# Patient Record
Sex: Male | Born: 1990 | Race: Black or African American | Hispanic: No | Marital: Single | Smoking: Current every day smoker
Health system: Southern US, Community
[De-identification: ages and names within clinical notes are randomized; demographics above are authoritative.]

## PROBLEM LIST (undated history)

## (undated) DIAGNOSIS — A4901 Methicillin susceptible Staphylococcus aureus infection, unspecified site: Secondary | ICD-10-CM

## (undated) DIAGNOSIS — R21 Rash and other nonspecific skin eruption: Secondary | ICD-10-CM

## (undated) DIAGNOSIS — R4189 Other symptoms and signs involving cognitive functions and awareness: Secondary | ICD-10-CM

## (undated) DIAGNOSIS — F909 Attention-deficit hyperactivity disorder, unspecified type: Secondary | ICD-10-CM

## (undated) HISTORY — DX: Other symptoms and signs involving cognitive functions and awareness: R41.89

## (undated) HISTORY — DX: Methicillin susceptible Staphylococcus aureus infection, unspecified site: A49.01

## (undated) HISTORY — DX: Rash and other nonspecific skin eruption: R21

## (undated) HISTORY — DX: Attention-deficit hyperactivity disorder, unspecified type: F90.9

---
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
## 2010-08-11 DIAGNOSIS — A4901 Methicillin susceptible Staphylococcus aureus infection, unspecified site: Secondary | ICD-10-CM

## 2010-08-11 HISTORY — DX: Methicillin susceptible Staphylococcus aureus infection, unspecified site: A49.01

## 2010-08-16 DIAGNOSIS — R21 Rash and other nonspecific skin eruption: Secondary | ICD-10-CM

## 2010-08-16 HISTORY — DX: Rash and other nonspecific skin eruption: R21

## 2010-09-20 ENCOUNTER — Encounter: Payer: Self-pay | Admitting: Internal Medicine

## 2010-09-20 ENCOUNTER — Ambulatory Visit (INDEPENDENT_AMBULATORY_CARE_PROVIDER_SITE_OTHER): Payer: Medicaid Other | Admitting: Internal Medicine

## 2010-09-20 VITALS — BP 112/81 | HR 101 | Temp 98.2°F | Ht 75.0 in | Wt 238.0 lb

## 2010-09-20 DIAGNOSIS — F259 Schizoaffective disorder, unspecified: Secondary | ICD-10-CM

## 2010-09-20 DIAGNOSIS — F17210 Nicotine dependence, cigarettes, uncomplicated: Secondary | ICD-10-CM | POA: Insufficient documentation

## 2010-09-20 DIAGNOSIS — L089 Local infection of the skin and subcutaneous tissue, unspecified: Secondary | ICD-10-CM

## 2010-09-20 DIAGNOSIS — B958 Unspecified staphylococcus as the cause of diseases classified elsewhere: Secondary | ICD-10-CM | POA: Insufficient documentation

## 2010-09-20 DIAGNOSIS — Z7251 High risk heterosexual behavior: Secondary | ICD-10-CM

## 2010-09-20 DIAGNOSIS — F17200 Nicotine dependence, unspecified, uncomplicated: Secondary | ICD-10-CM

## 2010-09-20 DIAGNOSIS — F988 Other specified behavioral and emotional disorders with onset usually occurring in childhood and adolescence: Secondary | ICD-10-CM

## 2010-09-20 MED ORDER — CEPHALEXIN 500 MG PO CAPS: 500.0000 mg | ORAL_CAPSULE | Freq: Four times a day (QID) | ORAL | Status: AC

## 2010-09-20 MED ORDER — CEPHALEXIN 500 MG PO CAPS: 500.0000 mg | ORAL_CAPSULE | Freq: Four times a day (QID) | ORAL | Status: DC

## 2010-09-20 NOTE — Patient Instructions (Signed)
#  1 stop taking doxycycline.  #2 start taking Keflex as directed.

## 2010-09-20 NOTE — Progress Notes (Signed)
  Subjective:    Patient ID: Travis Fowler, male    DOB: 1991-05-07, 20 y.o.   MRN: [id number]  HPI Travis Fowler is a 20 year old was referred to me by Dr. Leilani Able for evaluation of recent staph infections on his fingertips. He lives in a group home and he is care provider is with him today and provides much of his history. He barely has schizoaffective disorder and attention deficit disorder and has lived much of his life in group homes. He says that in November of last year he began to develop painful swellings on his fingertips. He seems to think that this began after he moved a Christmas tree. He has not had any fever, chills, or sweats. The lesions have persisted on multiple fingertips. He recalls having one boil on his buttocks several years ago but no other skin lesions. He was referred to Dr. Para Skeans, dermatologist, who culture the lesions last month. The culture grew methicillin sensitive staph aureus sensitive to tetracycline antibiotics. He has been on doxycycline for about one month and noted significant improvement in the lesions after starting this antibiotic. However, he is a little concerned that some of the lesions are a little firmer recently.  He has been trying to lose weight over the last year and has made significant changes in his eating habits. He believes he has lost about 30 pounds all of it intentionally. He has been sexually active with multiple male partners. He has not use condoms consistently. He does not know of any history of 60 transmitted diseases in himself or in any of his partners but has never been tested that he knows of.    Review of Systems  Constitutional: Negative for fever, chills, diaphoresis, appetite change and fatigue.  HENT: Negative for mouth sores.   Eyes: Negative for discharge, redness and visual disturbance.  Respiratory: Negative for cough and shortness of breath.   Genitourinary: Negative for dysuria, discharge and genital sores.    Musculoskeletal: Negative for myalgias, joint swelling and arthralgias.       Objective:   Physical Exam  Constitutional: He appears well-developed and well-nourished. No distress.  HENT:  Mouth/Throat: Oropharynx is clear and moist. No oropharyngeal exudate.  Eyes: Conjunctivae are normal. Pupils are equal, round, and reactive to light. Right eye exhibits no discharge. Left eye exhibits no discharge.  Cardiovascular: Normal rate and regular rhythm.   No murmur heard. Pulmonary/Chest: Breath sounds normal. He has no wheezes.  Abdominal: Soft. There is no tenderness.  Genitourinary: Penis normal.  Musculoskeletal: Normal range of motion. He exhibits no tenderness.  Lymphadenopathy:    He has no cervical adenopathy.       He has no palpable adenopathy.  Skin:       There are multiple firm, slightly scarred, lesions on multiple fingertips. They're nontender and there is no drainage, redness, or other signs of active infection. He has no splinter hemorrhages.  Psychiatric: He has a normal mood and affect. His behavior is normal. Thought content normal.          Assessment & Plan:

## 2010-09-20 NOTE — Progress Notes (Signed)
Addended by: Jennet Maduro on: 09/20/2010 03:18 PM   Modules accepted: Orders

## 2010-09-20 NOTE — Assessment & Plan Note (Signed)
I am not certain what has caused his fingertip infections. I do not see any convincing evidence that he said any systemic infection or systemic emboli. I doubt that he has been bacteremic given the lack of fever and the duration of these lesions. He is clearly better with doxycycline therapy but he and his caregiver are worried that they may be coming back a little bit. I will switch from doxycycline to Keflex to give him a little better her more durable coverage for MSSA. I've also recommended testing for 60 transmitted diseases and he agrees.

## 2010-09-21 LAB — GC/CHLAMYDIA PROBE AMP, URINE: GC Probe Amp, Urine: NEGATIVE

## 2010-10-11 ENCOUNTER — Ambulatory Visit (INDEPENDENT_AMBULATORY_CARE_PROVIDER_SITE_OTHER): Payer: Medicaid Other | Admitting: Internal Medicine

## 2010-10-11 ENCOUNTER — Other Ambulatory Visit: Payer: Self-pay | Admitting: Licensed Clinical Social Worker

## 2010-10-11 ENCOUNTER — Encounter: Payer: Self-pay | Admitting: Internal Medicine

## 2010-10-11 VITALS — BP 104/63 | HR 105 | Temp 98.1°F | Ht 75.0 in | Wt 215.5 lb

## 2010-10-11 DIAGNOSIS — L089 Local infection of the skin and subcutaneous tissue, unspecified: Secondary | ICD-10-CM

## 2010-10-11 DIAGNOSIS — B958 Unspecified staphylococcus as the cause of diseases classified elsewhere: Secondary | ICD-10-CM

## 2010-10-11 MED ORDER — CEPHALEXIN 500 MG PO CAPS: 500.0000 mg | ORAL_CAPSULE | Freq: Four times a day (QID) | ORAL | Status: AC

## 2010-10-11 MED ORDER — CEPHALEXIN 500 MG PO CAPS: 500.0000 mg | ORAL_CAPSULE | Freq: Four times a day (QID) | ORAL | Status: DC

## 2010-10-11 NOTE — Progress Notes (Signed)
  Subjective:    Patient ID: Travis Fowler, male    DOB: [birth year], 19 y.o.   MRN: [id number]  HPI Travis Fowler is in for his f/u visit. He took Keflex for 10 days after his recent visit and developed a new lesion on his right index finger while on it. He has also not hypopigmentation over his nuckles recently.    Review of Systems     Objective:   Physical Exam  Constitutional: He appears well-developed and well-nourished. No distress.  HENT:  Mouth/Throat: Oropharynx is clear and moist. No oropharyngeal exudate.  Skin:       He has a scab on his right index fingertip.There is no cellulitis, drainage or fluctuance. It is non tender. He does have linear hypopigmentation over his proximal MCP joints.          Assessment & Plan:

## 2010-10-11 NOTE — Assessment & Plan Note (Signed)
Travis Fowler and his care giver think Abs help these lesions but I do not think this is simply a case of recurrent Staph infections. I would like to get Dr. Dorita Sciara opinion about the potential utility of a biopsy. I also wonder if the hypopigmentation might be a clue as to the cause of these lesions.

## 2010-11-15 ENCOUNTER — Ambulatory Visit: Payer: Medicaid Other | Admitting: Internal Medicine

## 2014-02-01 ENCOUNTER — Emergency Department (HOSPITAL_COMMUNITY)
Admission: EM | Admit: 2014-02-01 | Discharge: 2014-02-01 | Disposition: A | Discharge status: Home / Self Care | Payer: Medicaid Other | Attending: Emergency Medicine | Admitting: Emergency Medicine

## 2014-02-01 ENCOUNTER — Encounter (HOSPITAL_COMMUNITY): Payer: Self-pay | Admitting: Emergency Medicine

## 2014-02-01 DIAGNOSIS — Z22322 Carrier or suspected carrier of Methicillin resistant Staphylococcus aureus: Secondary | ICD-10-CM | POA: Diagnosis not present

## 2014-02-01 DIAGNOSIS — Z79899 Other long term (current) drug therapy: Secondary | ICD-10-CM | POA: Insufficient documentation

## 2014-02-01 DIAGNOSIS — L089 Local infection of the skin and subcutaneous tissue, unspecified: Secondary | ICD-10-CM | POA: Insufficient documentation

## 2014-02-01 DIAGNOSIS — F17200 Nicotine dependence, unspecified, uncomplicated: Secondary | ICD-10-CM | POA: Diagnosis not present

## 2014-02-01 DIAGNOSIS — F909 Attention-deficit hyperactivity disorder, unspecified type: Secondary | ICD-10-CM | POA: Insufficient documentation

## 2014-02-01 MED ORDER — MUPIROCIN CALCIUM 2 % NA OINT: TOPICAL_OINTMENT | NASAL | Status: DC

## 2014-02-01 NOTE — ED Notes (Addendum)
Pt c/o skin infection to abdomen and thighs. Pt goes to a day program everyday. Caregiver states the program will not allow pt back until it is confirmed he doesn't have MRSA. Skin infection was first reported to caregiver a few weeks ago. Pt was seen by PCP on Thursday and was given Bactroban. PCP did not perform a culture. No other complaints

## 2014-02-01 NOTE — Discharge Instructions (Signed)
Please use the antibiotic ointment in the nose daily to help treat any possible MRSA bacteria colonization. Followup with your primary care provider for continued evaluation and treatment.    MRSA FAQs What is MRSA? Staphylococcus aureus (pronounced staff-ill-oh-KOK-us AW-ree-us), or "Staph" is a very common germ that about 1 out of every 3 people have on their skin or in their nose. This germ does not cause any problems for most people who have it on their skin. But sometimes it can cause serious infections such as skin or wound infections, pneumonia, or infections of the blood.  Antibiotics are given to kill Staph germs when they cause infections. Some Staph are resistant, meaning they cannot be killed by some antibiotics. "Methicillin-resistant Staphylococcus aureus" or "MRSA" is a type of Staph that is resistant to some of the antibiotics that are often used to treat Staph infections. Who is most likely to get an MRSA infection? In the hospital, people who are more likely to get an MRSA infection are people who:  have other health conditions making them sick  have been in the hospital or a nursing home  have been treated with antibiotics. People who are healthy and who have not been in the hospital or a nursing home can also get MRSA infections. These infections usually involve the skin. More information about this type of MRSA infection, known as "community-associated MRSA" infection, is available from the Centers for Disease Control and Prevention (CDC). ReportNation.uy How do I get an MRSA infection? People who have MRSA germs on their skin or who are infected with MRSA may be able to spread the germ to other people. MRSA can be passed on to bed linens, bed rails, bathroom fixtures, and medical equipment. It can spread to other people on contaminated equipment and on the hands of doctors, nurses, other healthcare providers and visitors. Can MRSA infections be treated? Yes, there  are antibiotics that can kill MRSA germs. Some patients with MRSA abscesses may need surgery to drain the infection. Your healthcare provider will determine which treatments are best for you. What are some of the things that hospitals are doing to prevent MRSA infections? To prevent MRSA infections, doctors, nurses and other healthcare providers:  Clean their hands with soap and water or an alcohol-based hand rub before and after caring for every patient.  Carefully clean hospital rooms and medical equipment.  Use Contact Precautions when caring for patients with MRSA. Contact Precautions mean:  Whenever possible, patients with MRSA will have a single room or will share a room only with someone else who also has MRSA.  Healthcare providers will put on gloves and wear a gown over their clothing while taking care of patients with MRSA.  Visitors may also be asked to wear a gown and gloves.  When leaving the room, hospital providers and visitors remove their gown and gloves and clean their hands.  Patients on Contact Precautions are asked to stay in their hospital rooms as much as possible. They should not go to common areas, such as the gift shop or cafeteria. They may go to other areas of the hospital for treatments and tests.  May test some patients to see if they have MRSA on their skin. This test involves rubbing a cotton-tipped swab in the patient's nostrils or on the skin. What can I do to help prevent MRSA infections? In the hospital  Make sure that all doctors, nurses, and other healthcare providers clean their hands with soap and water or an  alcohol-based hand rub before and after caring for you. If you do not see your providers clean their hands, please ask them to do so. When you go home  If you have wounds or an intravascular device (such as a catheter or dialysis port) make sure that you know how to take care of them. Can my friends and family get MRSA when they visit me? The  chance of getting MRSA while visiting a person who has MRSA is very low. To decrease the chance of getting MRSA your family and friends should:  Clean their hands before they enter your room and when they leave.  Ask a healthcare provider if they need to wear protective gowns and gloves when they visit you. What do I need to do when I go home from the hospital? To prevent another MRSA infection and to prevent spreading MRSA to others:  Keep taking any antibiotics prescribed by your doctor. Don't take half-doses or stop before you complete your prescribed course.  Clean your hands often, especially before and after changing your wound dressing or bandage.  People who live with you should clean their hands often as well.  Keep any wounds clean and change bandages as instructed until healed.  Avoid sharing personal items such as towels or razors.  Wash and dry your clothes and bed linens in the warmest temperatures recommended on the labels.  Tell your healthcare providers that you have MRSA. This includes home health nurses and aides, therapists, and personnel in doctors' offices.  Your doctor may have more instructions for you. If you have questions, please ask your doctor or nurse. Developed and co-sponsored by [employer] for [employer] [place] [phone]); Infectious Diseases Society of America (IDSA); [facility] Association; Association for Professionals in Infection Control and Epidemiology (APIC); Centers for Disease Control and Prevention (CDC); and [employer] Document Released: 06/03/2013 Document Reviewed: 06/03/2013 [facility] Patient Information 2015 [place], [place]. This information is not intended to replace advice given to you by your health care provider. Make sure you discuss any questions you have with your health care provider.

## 2014-02-01 NOTE — ED Provider Notes (Signed)
CSN: [id number]     Arrival date & time 02/01/14  2042 History   First MD Initiated Contact with Patient 02/01/14 2109     Chief Complaint  Patient presents with  . Recurrent Skin Infections   HPI  History provided by the patient and caregiver. Patient is a 23 year old male who presents with concerns for possible MRSA infection. Patient recently had infections the skin of his lower abdomen that was draining some pus. He was seen by PCP earlier this week and given prescriptions for doxycycline. He has been taking this with good improvement of the wounds. Patient does attend a day program and they are requesting to know if he has MRSA. Patient and caregiver are requesting that his infection or blood be tested. Denies any other complaints. No redness or pain. No fever, chills or sweats. There is no other bleeding or drainage.   Past Medical History  Diagnosis Date  . 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 Alinda Sierras  . 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 ADHD (attention deficit hyperactivity disorder)   . 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 Staphylococcus aureus infection 08/11/2010    left thumb   History reviewed. No pertinent past surgical history. History reviewed. No pertinent family history. History  Substance Use Topics  . Smoking status: Current Every Day Smoker -- 0.30 packs/day for 3 years  . Smokeless tobacco: Never Used  . Alcohol Use: No    Review of Systems  Constitutional: Negative for fever, chills and diaphoresis.  All other systems reviewed and are negative.     Allergies  Review of patient's allergies indicates no known allergies.  Home Medications   Prior to Admission medications   Medication Sig Start Date End Date Taking? Authorizing Provider  Atomoxetine HCl (STRATTERA PO) Take by mouth.      Historical Provider, MD  Divalproex Sodium (DEPAKOTE PO) Take by mouth.      Historical  Provider, MD  HALOPERIDOL PO Take by mouth.      Historical Provider, MD  HYDROXYZINE HCL PO Take by mouth.      Historical Provider, MD  METHYLPHENIDATE HCL CR PO Take by mouth.      Historical Provider, MD  QUEtiapine Fumarate (SEROQUEL PO) Take by mouth.      Historical Provider, MD  SERTRALINE HCL PO Take by mouth.      Historical Provider, MD  TOPIRAMATE PO Take by mouth.      Historical Provider, MD   BP 128/71  Pulse 106  Temp(Src) 98.6 F (37 C) (Oral)  Resp 16  Ht  (1.956 m)  Wt 205 lb (92.987 kg)  BMI 24.30 kg/m2  SpO2 100% Physical Exam  Nursing note and vitals reviewed. Constitutional: He appears well-developed and well-nourished. No distress.  HENT:  Head: Normocephalic.  Cardiovascular: Normal rate and regular rhythm.   Pulmonary/Chest: Effort normal and breath sounds normal. No respiratory distress.  Abdominal: Soft.  Neurological: He is alert.  Skin: Skin is warm.  Well healing area at the lower abdomen with slight darkening and peeling of the skin. No induration. No bleeding or drainage.    ED Course  Procedures   COORDINATION OF CARE:  Nursing notes reviewed. Vital signs reviewed. Initial pt interview and examination performed.   Filed Vitals:   02/01/14 2049  BP: 128/71  Pulse: 106  Temp: 98.6 F (37 C)  TempSrc: Oral  Resp: 16  Height:  (1.956 m)  Weight: 205 lb (92.987 kg)  SpO2: 100%    9:36 PM-patient and caregiver concern for possible MRSA infection no signs of significant infection at this time currently treated with doxycycline. Wounds appear to be hearing well. I discussed with patient and caregiver that there were no good way to test the wound at this point. They're still concerned about the possibilities that he is a carrier for MRSA and we discussed hostilities of nasal swab screen culture. They do wish to have this performed. I will also plan to prescribe Bactroban nasally for them.       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

## 2014-02-03 ENCOUNTER — Telehealth (HOSPITAL_BASED_OUTPATIENT_CLINIC_OR_DEPARTMENT_OTHER): Payer: Self-pay | Admitting: Emergency Medicine

## 2014-02-04 LAB — NASAL CULTURE (N/P): Culture: NORMAL

## 2014-02-04 MED ORDER — MUPIROCIN CALCIUM 2 % NA OINT: TOPICAL_OINTMENT | NASAL | Status: DC

## 2014-02-05 NOTE — ED Provider Notes (Signed)
Medical screening examination/treatment/procedure(s) were performed by non-physician practitioner and as supervising physician I was immediately available for consultation/collaboration.   EKG Interpretation None        Taiwan Millon, MD 02/05/14 0002 

## 2014-03-05 ENCOUNTER — Encounter (HOSPITAL_COMMUNITY): Payer: Self-pay | Admitting: Emergency Medicine

## 2014-03-05 ENCOUNTER — Emergency Department (HOSPITAL_COMMUNITY)
Admission: EM | Admit: 2014-03-05 | Discharge: 2014-03-06 | Disposition: A | Discharge status: Home / Self Care | Payer: Medicaid Other | Attending: Emergency Medicine | Admitting: Emergency Medicine

## 2014-03-05 DIAGNOSIS — Z872 Personal history of diseases of the skin and subcutaneous tissue: Secondary | ICD-10-CM | POA: Insufficient documentation

## 2014-03-05 DIAGNOSIS — Z79899 Other long term (current) drug therapy: Secondary | ICD-10-CM | POA: Insufficient documentation

## 2014-03-05 DIAGNOSIS — Z046 Encounter for general psychiatric examination, requested by authority: Secondary | ICD-10-CM | POA: Diagnosis present

## 2014-03-05 DIAGNOSIS — F988 Other specified behavioral and emotional disorders with onset usually occurring in childhood and adolescence: Secondary | ICD-10-CM

## 2014-03-05 DIAGNOSIS — F1510 Other stimulant abuse, uncomplicated: Secondary | ICD-10-CM | POA: Diagnosis not present

## 2014-03-05 DIAGNOSIS — R45850 Homicidal ideations: Secondary | ICD-10-CM | POA: Diagnosis not present

## 2014-03-05 DIAGNOSIS — F4324 Adjustment disorder with disturbance of conduct: Secondary | ICD-10-CM | POA: Diagnosis present

## 2014-03-05 DIAGNOSIS — F911 Conduct disorder, childhood-onset type: Secondary | ICD-10-CM | POA: Diagnosis not present

## 2014-03-05 DIAGNOSIS — R451 Restlessness and agitation: Secondary | ICD-10-CM | POA: Diagnosis present

## 2014-03-05 DIAGNOSIS — F909 Attention-deficit hyperactivity disorder, unspecified type: Secondary | ICD-10-CM | POA: Insufficient documentation

## 2014-03-05 DIAGNOSIS — F17200 Nicotine dependence, unspecified, uncomplicated: Secondary | ICD-10-CM | POA: Diagnosis not present

## 2014-03-05 DIAGNOSIS — F259 Schizoaffective disorder, unspecified: Secondary | ICD-10-CM

## 2014-03-05 DIAGNOSIS — Z8619 Personal history of other infectious and parasitic diseases: Secondary | ICD-10-CM | POA: Insufficient documentation

## 2014-03-05 DIAGNOSIS — R454 Irritability and anger: Secondary | ICD-10-CM

## 2014-03-05 LAB — ETHANOL: Alcohol, Ethyl (B): <11 mg/dL (ref 0–11)

## 2014-03-05 LAB — ACETAMINOPHEN LEVEL

## 2014-03-05 LAB — CBC
HEMATOCRIT: 37.8 % — AB (ref 39.0–52.0)
HEMOGLOBIN: 12.4 g/dL — AB (ref 13.0–17.0)
MCH: 31.1 pg (ref 26.0–34.0)
MCHC: 32.8 g/dL (ref 30.0–36.0)
MCV: 94.7 fL (ref 78.0–100.0)
Platelets: 265 10*3/uL (ref 150–400)
RBC: 3.99 MIL/uL — ABNORMAL LOW (ref 4.22–5.81)
RDW: 13.4 % (ref 11.5–15.5)
WBC: 7 10*3/uL (ref 4.0–10.5)

## 2014-03-05 LAB — COMPREHENSIVE METABOLIC PANEL
ALK PHOS: 41 U/L (ref 39–117)
ALT: 15 U/L (ref 0–53)
ANION GAP: 12 (ref 5–15)
AST: 30 U/L (ref 0–37)
Albumin: 3.7 g/dL (ref 3.5–5.2)
BILIRUBIN TOTAL: 0.3 mg/dL (ref 0.3–1.2)
BUN: 15 mg/dL (ref 6–23)
CHLORIDE: 105 meq/L (ref 96–112)
CO2: 20 meq/L (ref 19–32)
Calcium: 9.3 mg/dL (ref 8.4–10.5)
Creatinine, Ser: 0.73 mg/dL (ref 0.50–1.35)
GLUCOSE: 93 mg/dL (ref 70–99)
POTASSIUM: 4.2 meq/L (ref 3.7–5.3)
Sodium: 137 mEq/L (ref 137–147)
Total Protein: 8.3 g/dL (ref 6.0–8.3)

## 2014-03-05 LAB — SALICYLATE LEVEL: Salicylate Lvl: <2 mg/dL — ABNORMAL LOW (ref 2.8–20.0)

## 2014-03-05 MED ORDER — IBUPROFEN 200 MG PO TABS: 600.0000 mg | ORAL_TABLET | Freq: Three times a day (TID) | ORAL | Status: DC | PRN

## 2014-03-05 MED ORDER — ONDANSETRON HCL 4 MG PO TABS: 4.0000 mg | ORAL_TABLET | Freq: Three times a day (TID) | ORAL | Status: DC | PRN

## 2014-03-05 MED ORDER — NICOTINE 21 MG/24HR TD PT24: 21.0000 mg | MEDICATED_PATCH | Freq: Every day | TRANSDERMAL | Status: DC

## 2014-03-05 MED ORDER — LORAZEPAM 1 MG PO TABS: 1.0000 mg | ORAL_TABLET | Freq: Three times a day (TID) | ORAL | Status: DC | PRN

## 2014-03-05 NOTE — ED Notes (Addendum)
[facility] [phone] [facility] Password: [email or web address]
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
## 2014-03-05 NOTE — ED Notes (Signed)
Pt unable to void at this time. 

## 2014-03-06 ENCOUNTER — Encounter (HOSPITAL_COMMUNITY): Payer: Self-pay | Admitting: Psychiatry

## 2014-03-06 DIAGNOSIS — R451 Restlessness and agitation: Secondary | ICD-10-CM | POA: Diagnosis present

## 2014-03-06 DIAGNOSIS — F4324 Adjustment disorder with disturbance of conduct: Secondary | ICD-10-CM

## 2014-03-06 LAB — RAPID URINE DRUG SCREEN, HOSP PERFORMED
Amphetamines: POSITIVE — AB
BARBITURATES: NOT DETECTED
Benzodiazepines: NOT DETECTED
COCAINE: NOT DETECTED
Opiates: NOT DETECTED
TETRAHYDROCANNABINOL: NOT DETECTED

## 2014-03-06 LAB — VALPROIC ACID LEVEL: Valproic Acid Lvl: 95.3 ug/mL (ref 50.0–100.0)

## 2014-03-06 MED ORDER — SERTRALINE HCL 50 MG PO TABS
150.0000 mg | ORAL_TABLET | Freq: Every day | ORAL | Status: DC
  Administered 2014-03-06: 150 mg via ORAL
  Filled 2014-03-06: qty 3

## 2014-03-06 MED ORDER — ATOMOXETINE HCL 40 MG PO CAPS
100.0000 mg | ORAL_CAPSULE | Freq: Every day | ORAL | Status: DC
  Administered 2014-03-06: 100 mg via ORAL
  Filled 2014-03-06: qty 1

## 2014-03-06 MED ORDER — TOPIRAMATE 25 MG PO TABS: 100.0000 mg | ORAL_TABLET | Freq: Two times a day (BID) | ORAL | Status: DC

## 2014-03-06 MED ORDER — QUETIAPINE FUMARATE 300 MG PO TABS
300.0000 mg | ORAL_TABLET | Freq: Every morning | ORAL | Status: DC
Start: 2014-03-06 — End: 2014-03-06
  Administered 2014-03-06: 300 mg via ORAL
  Filled 2014-03-06: qty 1

## 2014-03-06 MED ORDER — TOPIRAMATE 25 MG PO TABS: 150.0000 mg | ORAL_TABLET | Freq: Every day | ORAL | Status: DC

## 2014-03-06 MED ORDER — AMPHETAMINE-DEXTROAMPHETAMINE 10 MG PO TABS
30.0000 mg | ORAL_TABLET | Freq: Every day | ORAL | Status: DC
  Administered 2014-03-06: 30 mg via ORAL
  Filled 2014-03-06: qty 3

## 2014-03-06 MED ORDER — QUETIAPINE FUMARATE 300 MG PO TABS: 600.0000 mg | ORAL_TABLET | Freq: Every day | ORAL | Status: DC

## 2014-03-06 MED ORDER — DIVALPROEX SODIUM ER 500 MG PO TB24
500.0000 mg | ORAL_TABLET | Freq: Two times a day (BID) | ORAL | Status: DC
  Administered 2014-03-06: 500 mg via ORAL
  Filled 2014-03-06 (×2): qty 1

## 2014-03-06 MED ORDER — TOPIRAMATE 25 MG PO TABS
100.0000 mg | ORAL_TABLET | Freq: Every day | ORAL | Status: DC
  Administered 2014-03-06: 100 mg via ORAL
  Filled 2014-03-06: qty 4

## 2014-03-06 NOTE — ED Notes (Signed)
Pt's visitor here to take pt home. Pt not currently up for discharge.  Notified TTS, stated they will get him ready for discharge.

## 2014-03-06 NOTE — Discharge Instructions (Signed)
Aggression °Physically aggressive behavior is common among small children. When frustrated or angry, toddlers may act out. Often, they will push, bite, or hit. Most children show less physical aggression as they grow up. Their language and interpersonal skills improve, too. But continued aggressive behavior is a sign of a problem. This behavior can lead to aggression and delinquency in adolescence and adulthood. °Aggressive behavior can be psychological or physical. Forms of psychological aggression include threatening or bullying others. Forms of physical aggression include:  °· Pushing. °· Hitting. °· Slapping. °· Kicking. °· Stabbing. °· Shooting. °· Raping.  °PREVENTION  °Encouraging the following behaviors can help manage aggression: °· Respecting others and valuing differences. °· Participating in school and community functions, including sports, music, after-school programs, community groups, and volunteer work. °· Talking with an adult when they are sad, depressed, fearful, anxious, or angry. Discussions with a parent or other family member, counselor, teacher, or coach can help. °· Avoiding alcohol and drug use. °· Dealing with disagreements without aggression, such as conflict resolution. To learn this, children need parents and caregivers to model respectful communication and problem solving. °· Limiting exposure to aggression and violence, such as video games that are not age appropriate, violence in the media, or domestic violence. °Document Released: 03/26/2007 Document Revised: 08/21/2011 Document Reviewed: 08/04/2010 °ExitCare® Patient Information ©2015 [employer]. This information is not intended to replace advice given to you by your health care provider. Make sure you discuss any questions you have with your health care provider. ° °
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
## 2014-03-06 NOTE — BH Assessment (Signed)
[facility] Assessment Progress Note   Pt was too sleepy to assess at 04:00.  Will attempt again later.

## 2014-03-06 NOTE — [facility] Suicide Risk Assessment (Signed)
Suicide Risk Assessment  Discharge Assessment     Demographic Factors:  Male and Adolescent or young adult  Total Time spent with patient: 20 minutes  Psychiatric Specialty Exam:     Blood pressure 110/58, pulse 88, temperature 98.3 F (36.8 C), temperature source Oral, resp. rate 16, SpO2 100.00%.There is no weight on file to calculate BMI.  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 Physical Health Resilience Social Support Transportation  Sleep:       Musculoskeletal: Strength & Muscle Tone: within normal limits Gait & Station: normal Patient leans: N/A   Mental Status Per Nursing Assessment::   On Admission:   agitation  Current Mental Status by Physician: NA  Loss Factors: NA  Historical Factors: Impulsivity  Risk Reduction Factors:   Living with another person, especially a relative, Positive social support and Positive therapeutic relationship  Continued Clinical Symptoms:  None  Cognitive Features That Contribute To Risk:  None  Suicide Risk:  Minimal: No identifiable suicidal ideation.  Patients presenting with no risk factors but with morbid ruminations; may be classified as minimal risk based on the severity of the depressive symptoms  Discharge Diagnoses:   AXIS I:  Adjustment Disorder with Disturbance of Conduct AXIS II:  Deferred AXIS III:   Past Medical History  Diagnosis Date  .  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 Alinda Sierras  . 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 ADHD (attention deficit hyperactivity disorder)   . 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 Staphylococcus aureus infection 08/11/2010    left thumb   AXIS IV:  other psychosocial or environmental problems, problems related to social environment and problems with primary support group AXIS V:  61-70 mild symptoms  Plan Of Care/Follow-up recommendations:  Activity:  as tolerated  Diet:  low sodium heart healthy diet  Is patient on multiple antipsychotic therapies at discharge:  No   Has Patient had three or more failed trials of antipsychotic monotherapy by history:  No  Recommended Plan for Multiple Antipsychotic Therapies: NA    LORD, JAMISON, PMH-NP 03/06/2014, 10:55 AM
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
## 2014-03-06 NOTE — ED Notes (Signed)
Pt still unable to void at this time 

## 2014-03-06 NOTE — ED Notes (Addendum)
Pt's belongings consisting of two bags of clothes, a pair of nike shoes and a coat  is located in [place] [address]

## 2014-03-10 NOTE — ED Provider Notes (Signed)
Medical screening examination/treatment/procedure(s) were performed by non-physician practitioner and as supervising physician I was immediately available for consultation/collaboration.   EKG Interpretation None        Audree CamelScott T Alyshia Kernan, MD 03/10/14 2325

## 2015-01-02 ENCOUNTER — Encounter (HOSPITAL_COMMUNITY): Payer: Self-pay | Admitting: *Deleted

## 2015-01-02 ENCOUNTER — Emergency Department (HOSPITAL_COMMUNITY)
Admission: EM | Admit: 2015-01-02 | Discharge: 2015-01-03 | Disposition: A | Discharge status: Home / Self Care | Payer: Medicaid Other | Attending: Emergency Medicine | Admitting: Emergency Medicine

## 2015-01-02 DIAGNOSIS — Z79899 Other long term (current) drug therapy: Secondary | ICD-10-CM | POA: Diagnosis not present

## 2015-01-02 DIAGNOSIS — Z872 Personal history of diseases of the skin and subcutaneous tissue: Secondary | ICD-10-CM | POA: Insufficient documentation

## 2015-01-02 DIAGNOSIS — R451 Restlessness and agitation: Secondary | ICD-10-CM | POA: Insufficient documentation

## 2015-01-02 DIAGNOSIS — R21 Rash and other nonspecific skin eruption: Secondary | ICD-10-CM | POA: Diagnosis not present

## 2015-01-02 DIAGNOSIS — Z720 Tobacco use: Secondary | ICD-10-CM | POA: Diagnosis not present

## 2015-01-02 DIAGNOSIS — F4324 Adjustment disorder with disturbance of conduct: Secondary | ICD-10-CM | POA: Diagnosis present

## 2015-01-02 DIAGNOSIS — Z8619 Personal history of other infectious and parasitic diseases: Secondary | ICD-10-CM | POA: Insufficient documentation

## 2015-01-02 DIAGNOSIS — F911 Conduct disorder, childhood-onset type: Secondary | ICD-10-CM | POA: Diagnosis present

## 2015-01-02 LAB — COMPREHENSIVE METABOLIC PANEL
ALBUMIN: 3.7 g/dL (ref 3.5–5.0)
ALT: 12 U/L — ABNORMAL LOW (ref 17–63)
AST: 23 U/L (ref 15–41)
Alkaline Phosphatase: 35 U/L — ABNORMAL LOW (ref 38–126)
Anion gap: 5 (ref 5–15)
BILIRUBIN TOTAL: 0.3 mg/dL (ref 0.3–1.2)
BUN: 13 mg/dL (ref 6–20)
CO2: 23 mmol/L (ref 22–32)
Calcium: 8.9 mg/dL (ref 8.9–10.3)
Chloride: 111 mmol/L (ref 101–111)
Creatinine, Ser: 0.9 mg/dL (ref 0.61–1.24)
GFR calc Af Amer: >60 mL/min (ref >60)
GFR calc non Af Amer: >60 mL/min (ref >60)
Glucose, Bld: 83 mg/dL (ref 65–99)
Potassium: 3.9 mmol/L (ref 3.5–5.1)
Sodium: 139 mmol/L (ref 135–145)
Total Protein: 8.1 g/dL (ref 6.5–8.1)

## 2015-01-02 LAB — CBC
HCT: 41.6 % (ref 39.0–52.0)
Hemoglobin: 13.2 g/dL (ref 13.0–17.0)
MCH: 30.1 pg (ref 26.0–34.0)
MCHC: 31.7 g/dL (ref 30.0–36.0)
MCV: 94.8 fL (ref 78.0–100.0)
PLATELETS: 281 10*3/uL (ref 150–400)
RBC: 4.39 MIL/uL (ref 4.22–5.81)
RDW: 13.4 % (ref 11.5–15.5)
WBC: 6.5 10*3/uL (ref 4.0–10.5)

## 2015-01-02 LAB — RAPID URINE DRUG SCREEN, HOSP PERFORMED
Amphetamines: POSITIVE — AB
Barbiturates: NOT DETECTED
Benzodiazepines: NOT DETECTED
COCAINE: NOT DETECTED
Opiates: NOT DETECTED
Tetrahydrocannabinol: NOT DETECTED

## 2015-01-02 LAB — ETHANOL: Alcohol, Ethyl (B): <5 mg/dL (ref <5)

## 2015-01-02 LAB — SALICYLATE LEVEL: Salicylate Lvl: <4 mg/dL (ref 2.8–30.0)

## 2015-01-02 LAB — ACETAMINOPHEN LEVEL: Acetaminophen (Tylenol), Serum: <10 ug/mL — ABNORMAL LOW (ref 10–30)

## 2015-01-02 LAB — VALPROIC ACID LEVEL: Valproic Acid Lvl: 86 ug/mL (ref 50.0–100.0)

## 2015-01-02 MED ORDER — TOPIRAMATE 25 MG PO TABS: 150.0000 mg | ORAL_TABLET | Freq: Every day | ORAL | Status: DC

## 2015-01-02 MED ORDER — ATOMOXETINE HCL 60 MG PO CAPS
100.0000 mg | ORAL_CAPSULE | Freq: Every day | ORAL | Status: DC
  Administered 2015-01-03: 100 mg via ORAL
  Filled 2015-01-02: qty 1

## 2015-01-02 MED ORDER — AMPHETAMINE-DEXTROAMPHET ER 10 MG PO CP24
30.0000 mg | ORAL_CAPSULE | Freq: Every day | ORAL | Status: DC
  Administered 2015-01-02 – 2015-01-03 (×2): 30 mg via ORAL
  Filled 2015-01-02 (×2): qty 3

## 2015-01-02 MED ORDER — TOPIRAMATE 100 MG PO TABS: 100.0000 mg | ORAL_TABLET | Freq: Two times a day (BID) | ORAL | Status: DC

## 2015-01-02 MED ORDER — DIVALPROEX SODIUM ER 500 MG PO TB24
500.0000 mg | ORAL_TABLET | Freq: Two times a day (BID) | ORAL | Status: DC
  Administered 2015-01-02 – 2015-01-03 (×2): 500 mg via ORAL
  Filled 2015-01-02 (×3): qty 1

## 2015-01-02 MED ORDER — TOPIRAMATE 100 MG PO TABS
100.0000 mg | ORAL_TABLET | Freq: Every day | ORAL | Status: DC
  Administered 2015-01-03: 100 mg via ORAL
  Filled 2015-01-02: qty 1

## 2015-01-02 MED ORDER — TOPIRAMATE 100 MG PO TABS: 100.0000 mg | ORAL_TABLET | Freq: Every day | ORAL | Status: DC

## 2015-01-02 MED ORDER — QUETIAPINE FUMARATE 300 MG PO TABS
300.0000 mg | ORAL_TABLET | Freq: Two times a day (BID) | ORAL | Status: DC
  Administered 2015-01-02: 600 mg via ORAL
  Filled 2015-01-02: qty 2

## 2015-01-02 MED ORDER — TOPIRAMATE 25 MG PO TABS
150.0000 mg | ORAL_TABLET | Freq: Every day | ORAL | Status: DC
  Administered 2015-01-02: 150 mg via ORAL
  Filled 2015-01-02: qty 1
  Filled 2015-01-02: qty 2

## 2015-01-02 MED ORDER — QUETIAPINE FUMARATE 300 MG PO TABS: 600.0000 mg | ORAL_TABLET | Freq: Every day | ORAL | Status: DC

## 2015-01-02 MED ORDER — SERTRALINE HCL 50 MG PO TABS
150.0000 mg | ORAL_TABLET | Freq: Every day | ORAL | Status: DC
  Administered 2015-01-02 – 2015-01-03 (×2): 150 mg via ORAL
  Filled 2015-01-02 (×2): qty 3

## 2015-01-02 MED ORDER — QUETIAPINE FUMARATE 300 MG PO TABS
300.0000 mg | ORAL_TABLET | Freq: Every day | ORAL | Status: DC
  Administered 2015-01-03: 300 mg via ORAL
  Filled 2015-01-02: qty 1

## 2015-01-02 MED ORDER — QUETIAPINE FUMARATE 300 MG PO TABS: 300.0000 mg | ORAL_TABLET | Freq: Every day | ORAL | Status: DC
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
## 2015-01-02 NOTE — ED Notes (Signed)
Report given to terrence.

## 2015-01-02 NOTE — ED Notes (Signed)
Pt observed sitting on the side of the bed, rocking back and forth, will not answer any of my questions, he is avoiding eye contact. Dr Romeo Apple to evaluate pt.
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
## 2015-01-02 NOTE — ED Notes (Signed)
Pt's has in belonging bag:  Two phones (black and white), black and white charger, silver colored bracelet, gold colored watch, brown sunglasses, brown wallet ([employer] [phone], Visa card-[id number], (40) Twenty dollar bills, (1) One dollar bill, [place] ID card, red hat, two black shirts, red pants, black socks, red sandals, black lighter,
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
## 2015-01-02 NOTE — BH Assessment (Signed)
Assessment completed. Consulted Alberteen Sam, NP who recommended inpatient treatment. Dr. Romeo Apple has been informed of the recommendation.  TTS will contact facilities for placement.

## 2015-01-02 NOTE — ED Notes (Signed)
Patient has been wanded by security.  

## 2015-01-03 DIAGNOSIS — R451 Restlessness and agitation: Secondary | ICD-10-CM

## 2015-01-03 DIAGNOSIS — F4324 Adjustment disorder with disturbance of conduct: Secondary | ICD-10-CM

## 2015-01-03 NOTE — Progress Notes (Signed)
2:27pm. CSW called pt's AFL worker, Esmeralda Arthur, to inform that psychiatry has evaluated and they are recommending discharge. Gaynell Face states he will come to get pt at 3:15pm.   CSW to sign off.  York Spaniel [facility] Clinical Social Worker [facility] Department phone: [phone]

## 2015-01-03 NOTE — Progress Notes (Signed)
Disposition CSW completed referrals to the following Psych Facilities:  [facility]  CSW will continue to assist with patient's placement needs.  Seward Speck [facility] Disposition CSW [phone]

## 2015-01-03 NOTE — BHH Suicide Risk Assessment (Cosign Needed)
Suicide Risk Assessment  Discharge Assessment   [facility] Discharge Suicide Risk Assessment   Demographic Factors:  Male, Adolescent or young adult, Low socioeconomic status and Unemployed  Total Time spent with patient: 20 minutes  Musculoskeletal: Strength & Muscle Tone: within normal limits Gait & Station: normal Patient leans: N/A  Psychiatric Specialty Exam:     Blood pressure 127/67, pulse 93, temperature 98.3 F (36.8 C), temperature source Oral, resp. rate 18, height  (1.956 m), weight 113.399 kg (250 lb), SpO2 100 %.Body mass index is 29.64 kg/(m^2).  General Appearance: Casual  Eye Contact::  Good  Speech:  Clear and Coherent and Normal Rate  Volume:  Normal  Mood:  Angry  Affect:  Congruent  Thought Process:  Coherent, Goal Directed and Intact  Orientation:  Full (Time, Place, and Person)  Thought Content:  WDL  Suicidal Thoughts:  No  Homicidal Thoughts:  No  Memory:  Immediate;   Good Recent;   Good Remote;   Good  Judgement:  Fair  Insight:  Shallow  Psychomotor Activity:  Normal  Concentration:  Good  Recall:  NA  Fund of Knowledge:Fair  Language: Fair  Akathisia:  NA  Handed:  Right  AIMS (if indicated):     Assets:  Desire for Improvement  ADL's:  Intact  Cognition: WNL  Sleep:          Has this patient used any form of tobacco in the last 30 days? (Cigarettes, Smokeless Tobacco, Cigars, and/or Pipes) Yes, A prescription for an FDA-approved tobacco cessation medication was offered at discharge and the patient refused  Mental Status Per Nursing Assessment::   On Admission:     Current Mental Status by Physician: NA  Loss Factors: NA  Historical Factors: NA  Risk Reduction Factors:   Living with another person, especially a relative  Continued Clinical Symptoms:  More than one psychiatric diagnosis Previous Psychiatric Diagnoses and Treatments  Cognitive Features That Contribute To Risk:  Polarized thinking    Suicide Risk:   Minimal: No identifiable suicidal ideation.  Patients presenting with no risk factors but with morbid ruminations; may be classified as minimal risk based on the severity of the depressive symptoms  Principal Problem: Adjustment disorder with disturbance of conduct Discharge Diagnoses:  Patient Active Problem List   Diagnosis Date Noted  . 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 Agitation [R45.1] 03/06/2014  . Staph skin infection [L08.9, B95.8] 09/20/2010  . Schizoaffective disorder [F25.9] 09/20/2010  . ADD (attention deficit disorder) [F90.9] 09/20/2010  . Cigarette smoker [Z72.0] 09/20/2010  . Unprotected sexual intercourse [Z72.51] 09/20/2010      Plan Of Care/Follow-up recommendations:  Activity:  as tolerated Diet:  regular  Is patient on multiple antipsychotic therapies at discharge:  No   Has Patient had three or more failed trials of antipsychotic monotherapy by history:  No  Recommended Plan for Multiple Antipsychotic Therapies: NA    Lucifer Soja C   PMHN[P-BC 01/03/2015, 3:32 PM

## 2015-01-03 NOTE — Progress Notes (Signed)
[employer] would not allow e-signature. Pt verbalized understanding of discharge paperwork and after care instructions. Pt caretaker Everlean Alstrom acknowledged understanding of discharge paperwork and made aware that e-signature was not available at this time.
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
## 2015-01-26 ENCOUNTER — Encounter (HOSPITAL_COMMUNITY): Payer: Self-pay | Admitting: Emergency Medicine

## 2015-01-26 ENCOUNTER — Emergency Department (HOSPITAL_COMMUNITY)
Admission: EM | Admit: 2015-01-26 | Discharge: 2015-01-26 | Disposition: A | Discharge status: Home / Self Care | Payer: Medicaid Other | Attending: Emergency Medicine | Admitting: Emergency Medicine

## 2015-01-26 DIAGNOSIS — Y9389 Activity, other specified: Secondary | ICD-10-CM | POA: Insufficient documentation

## 2015-01-26 DIAGNOSIS — S0993XA Unspecified injury of face, initial encounter: Secondary | ICD-10-CM | POA: Diagnosis present

## 2015-01-26 DIAGNOSIS — Y9289 Other specified places as the place of occurrence of the external cause: Secondary | ICD-10-CM | POA: Insufficient documentation

## 2015-01-26 DIAGNOSIS — Z8619 Personal history of other infectious and parasitic diseases: Secondary | ICD-10-CM | POA: Insufficient documentation

## 2015-01-26 DIAGNOSIS — Z872 Personal history of diseases of the skin and subcutaneous tissue: Secondary | ICD-10-CM | POA: Diagnosis not present

## 2015-01-26 DIAGNOSIS — Z79899 Other long term (current) drug therapy: Secondary | ICD-10-CM | POA: Diagnosis not present

## 2015-01-26 DIAGNOSIS — S0181XA Laceration without foreign body of other part of head, initial encounter: Secondary | ICD-10-CM

## 2015-01-26 DIAGNOSIS — S61210A Laceration without foreign body of right index finger without damage to nail, initial encounter: Secondary | ICD-10-CM | POA: Insufficient documentation

## 2015-01-26 DIAGNOSIS — F909 Attention-deficit hyperactivity disorder, unspecified type: Secondary | ICD-10-CM | POA: Diagnosis not present

## 2015-01-26 DIAGNOSIS — W25XXXA Contact with sharp glass, initial encounter: Secondary | ICD-10-CM | POA: Diagnosis not present

## 2015-01-26 DIAGNOSIS — Y998 Other external cause status: Secondary | ICD-10-CM | POA: Diagnosis not present

## 2015-01-26 DIAGNOSIS — Z720 Tobacco use: Secondary | ICD-10-CM | POA: Insufficient documentation

## 2015-01-26 DIAGNOSIS — S61219A Laceration without foreign body of unspecified finger without damage to nail, initial encounter: Secondary | ICD-10-CM

## 2015-01-26 MED ORDER — LIDOCAINE HCL 2 % IJ SOLN
INTRAMUSCULAR | Status: AC
  Filled 2015-01-26: qty 20

## 2015-01-26 NOTE — Discharge Instructions (Signed)
Have sutures out in 10 days for your finger and 5 days for your face

## 2015-01-26 NOTE — ED Notes (Signed)
Bed: [facility] Expected date:  Expected time:  Means of arrival:  Comments: EMS/[employer]/med clearance
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
## 2015-01-26 NOTE — ED Notes (Signed)
Pt presents with [employer] for medical clearance. Laceration above left eye, minimal bleeding.

## 2017-07-17 ENCOUNTER — Emergency Department (HOSPITAL_COMMUNITY): Admission: EM | Admit: 2017-07-17 | Discharge: 2017-07-17 | Discharge status: Against Medical Advice | Payer: Self-pay

## 2017-07-17 NOTE — ED Notes (Signed)
Per [employer] sitting in lobby, patients guardian came and picked up patient and informed them they were not waiting. Stickers given to Charity fundraiserN by registration.
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
## 2019-01-09 ENCOUNTER — Other Ambulatory Visit: Payer: Self-pay

## 2019-01-09 DIAGNOSIS — Z20822 Contact with and (suspected) exposure to covid-19: Secondary | ICD-10-CM

## 2019-01-11 LAB — NOVEL CORONAVIRUS, NAA: SARS-CoV-2, NAA: NOT DETECTED

## 2020-07-15 ENCOUNTER — Other Ambulatory Visit: Payer: Self-pay

## 2020-07-15 ENCOUNTER — Encounter: Payer: Self-pay | Admitting: Podiatry

## 2020-07-15 ENCOUNTER — Ambulatory Visit (INDEPENDENT_AMBULATORY_CARE_PROVIDER_SITE_OTHER): Payer: Medicaid Other | Admitting: Podiatry

## 2020-07-15 ENCOUNTER — Ambulatory Visit (INDEPENDENT_AMBULATORY_CARE_PROVIDER_SITE_OTHER): Payer: Medicaid Other

## 2020-07-15 DIAGNOSIS — M79671 Pain in right foot: Secondary | ICD-10-CM | POA: Diagnosis not present

## 2020-07-15 DIAGNOSIS — M79672 Pain in left foot: Secondary | ICD-10-CM

## 2020-07-15 DIAGNOSIS — M7752 Other enthesopathy of left foot: Secondary | ICD-10-CM | POA: Diagnosis not present

## 2020-07-15 DIAGNOSIS — M7751 Other enthesopathy of right foot: Secondary | ICD-10-CM

## 2020-07-15 DIAGNOSIS — M7662 Achilles tendinitis, left leg: Secondary | ICD-10-CM | POA: Diagnosis not present

## 2020-07-15 DIAGNOSIS — M779 Enthesopathy, unspecified: Secondary | ICD-10-CM

## 2020-07-15 MED ORDER — DICLOFENAC SODIUM 75 MG PO TBEC: 75.0000 mg | DELAYED_RELEASE_TABLET | Freq: Two times a day (BID) | ORAL | 2 refills | Status: AC

## 2020-07-15 MED ORDER — TRIAMCINOLONE ACETONIDE 10 MG/ML IJ SUSP
10.0000 mg | Freq: Once | INTRAMUSCULAR | Status: AC
  Administered 2020-07-15: 10 mg

## 2020-07-16 NOTE — Progress Notes (Signed)
Subjective:   Patient ID: Travis Fowler, male   DOB: 30 y.o.   MRN: [id number]   HPI Patient states has had quite a bit of pain on top of both feet and has flatfoot deformity and tries to stay active.  Patient smokes 3/10 of a pack per day and does this on it daily basis and tries to be active   Review of Systems  All other systems reviewed and are negative.       Objective:  Physical Exam Vitals and nursing note reviewed.  Constitutional:      Appearance: He is well-developed and well-nourished.  Cardiovascular:     Pulses: Intact distal pulses.  Pulmonary:     Effort: Pulmonary effort is normal.  Musculoskeletal:        General: Normal range of motion.  Skin:    General: Skin is warm.  Neurological:     Mental Status: He is alert.     Neurovascular status intact muscle strength was found to be adequate range of motion within normal limits.  Patient does have significant flatfoot deformity with moderate discomfort into the sinus tarsi bilateral and has quite a bit of discomfort around the second and third MPJs of both feet that is localized when pressed.  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

## 2021-05-18 ENCOUNTER — Encounter: Payer: Self-pay | Admitting: Podiatry

## 2021-05-18 ENCOUNTER — Ambulatory Visit (INDEPENDENT_AMBULATORY_CARE_PROVIDER_SITE_OTHER): Payer: Medicaid Other | Admitting: Podiatry

## 2021-05-18 ENCOUNTER — Other Ambulatory Visit: Payer: Self-pay

## 2021-05-18 DIAGNOSIS — M779 Enthesopathy, unspecified: Secondary | ICD-10-CM

## 2021-05-18 DIAGNOSIS — L600 Ingrowing nail: Secondary | ICD-10-CM

## 2021-05-19 NOTE — Progress Notes (Signed)
Subjective:   Patient ID: Travis Fowler, male   DOB: 30 y.o.   MRN: [id number]   HPI Patient presents with caregiver with damaged second nail bed of both feet that have become sore and are making it hard to wear shoe gear.  He does have digital deformities which are contributory and presents with caregiver   ROS      Objective:  Physical Exam  Neurovascular status intact muscle strength adequate range of motion adequate flatfoot deformity noted with patient found to have severe disease of the second nailbeds both feet that are growing under and there is hammertoe deformity which is probably part of the structural deformity and the pain the patient is experiencing     Assessment:  Damaged second nails bilateral secondary to structure and pathology with possible fungus     Plan:  H&P reviewed condition and recommended nail removal.  I explained procedure risk organ to hold off for now as he is working hard and patient will have this done and I educated him on what will be required.  All questions answered scheduled for the surgery in January

## 2021-08-08 ENCOUNTER — Other Ambulatory Visit: Payer: Self-pay

## 2021-08-08 ENCOUNTER — Ambulatory Visit (INDEPENDENT_AMBULATORY_CARE_PROVIDER_SITE_OTHER): Payer: Medicaid Other | Admitting: Podiatry

## 2021-08-08 ENCOUNTER — Encounter: Payer: Self-pay | Admitting: Podiatry

## 2021-08-08 DIAGNOSIS — M2041 Other hammer toe(s) (acquired), right foot: Secondary | ICD-10-CM

## 2021-08-08 DIAGNOSIS — M2042 Other hammer toe(s) (acquired), left foot: Secondary | ICD-10-CM | POA: Diagnosis not present

## 2021-08-08 DIAGNOSIS — L600 Ingrowing nail: Secondary | ICD-10-CM

## 2021-08-08 NOTE — Patient Instructions (Signed)

## 2021-08-09 NOTE — Progress Notes (Signed)
Subjective:   Patient ID: Travis Fowler, male   DOB: 31 y.o.   MRN: [id number]   HPI Patient presents with caregiver stating these nails are really bothering me on the second nails of both feet and also he knows he has digital deformities that bother him on the second toes need like them corrected long-term as he did have the third corrected number of years ago.  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  I do think long-term distal arthroplasty could be considered but first I want to make sure that the nailbeds heal properly and I explained procedure risk and consent form signed and also caregiver understands.  I infiltrated each hallux 60 mg like Marcaine mixture sterile prep done using sterile instrumentation remove the second nails exposed matrix applied phenol for applications 30 seconds followed by alcohol lavage sterile dressing gave instructions on soaks reappoint to recheck 5 weeks to discuss digital corrections

## 2021-08-29 ENCOUNTER — Telehealth: Payer: Self-pay | Admitting: Sports Medicine

## 2021-08-29 ENCOUNTER — Ambulatory Visit
Admission: EM | Admit: 2021-08-29 | Discharge: 2021-08-29 | Disposition: A | Discharge status: Home / Self Care | Payer: Medicaid Other | Attending: Internal Medicine | Admitting: Internal Medicine

## 2021-08-29 ENCOUNTER — Other Ambulatory Visit: Payer: Self-pay

## 2021-08-29 ENCOUNTER — Encounter: Payer: Self-pay | Admitting: Emergency Medicine

## 2021-08-29 DIAGNOSIS — L089 Local infection of the skin and subcutaneous tissue, unspecified: Secondary | ICD-10-CM

## 2021-08-29 MED ORDER — CEPHALEXIN 500 MG PO CAPS: 500.0000 mg | ORAL_CAPSULE | Freq: Four times a day (QID) | ORAL | 0 refills | Status: DC

## 2021-08-29 NOTE — ED Triage Notes (Signed)
Patient c/o left 2nd toe pain for several weeks.  Patient had his toenails removed and it's possibly infected. ?

## 2021-08-29 NOTE — ED Provider Notes (Addendum)
?[facility] ? ? ? ?CSN: [id number] ?Arrival date & time: 08/29/21  1058 ? ? ?  ? ?History   ?Chief Complaint ?Chief Complaint  ?Patient presents with  ? Foot Pain  ? ? ?HPI ?Travis Fowler is a 31 y.o. male.  ? ?Patient presents with bilateral second toe pain that has been present ever since toenails were removed by podiatry on 08/08/2021.  Patient reports that he noticed some purulent drainage to the toes and was concerned for infection.  Denies any numbness or tingling.  Has full range of motion of toes.  Denies any fevers, body aches, chills. ? ? ?Foot Pain ? ? ?Past Medical History:  ?Diagnosis Date  ? ADHD (attention deficit hyperactivity disorder)   ? Cognitive deficits   ? 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 seen by Dr. Alinda Sierras  ? Rash and nonspecific skin eruption 08/16/2010  ? hands, chronic  ? Staphylococcus aureus infection 08/11/2010  ? left thumb  ? ? ?Patient Active Problem List  ? Diagnosis Date Noted  ? 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 Agitation 03/06/2014  ? Staph skin infection 09/20/2010  ? Schizoaffective disorder (HCC) 09/20/2010  ? ADD (attention deficit disorder) 09/20/2010  ? Cigarette smoker 09/20/2010  ? Unprotected sexual intercourse 09/20/2010  ? ? ?History reviewed. No pertinent surgical history. ? ? ? ? ?Home Medications   ? ?Prior to Admission medications   ?Medication Sig Start Date End Date Taking? Authorizing Provider  ?amphetamine-dextroamphetamine (ADDERALL XR) 30 MG 24 hr capsule Take 30 mg by mouth daily.   Yes [provider]  ?atomoxetine (STRATTERA) 100 MG capsule Take 100 mg by mouth daily.   Yes [provider]  ?cephALEXin (KEFLEX) 500 MG capsule Take 1 capsule (500 mg total) by mouth 4 (four) times daily. 08/29/21  Yes Gustavus Bryant, FNP  ?diclofenac (VOLTAREN) 75 MG EC tablet Take 1 tablet (75 mg total) by mouth 2 (two) times daily. 07/15/20  Yes Lenn Sink, DPM   ?divalproex (DEPAKOTE ER) 500 MG 24 hr tablet Take 500 mg by mouth 2 (two) times daily.   Yes [provider]  ?levothyroxine (SYNTHROID) 75 MCG tablet Take 75 mcg by mouth daily. 07/22/21  Yes [provider]  ?Melatonin 3 MG SUBL Take 1 tablet by mouth at bedtime. 05/24/21  Yes [provider]  ?QUEtiapine (SEROQUEL) 300 MG tablet Take 300-600 mg by mouth 2 (two) times daily. Take 300mg  in the morning and then take 600mg  at night   Yes [provider]  ?sertraline (ZOLOFT) 100 MG tablet Take 150 mg by mouth daily.   Yes [provider]  ?topiramate (TOPAMAX) 100 MG tablet Take 100-150 mg by mouth 2 (two) times daily. 100mg  in the morning and 150mg  at bedtime.   Yes [provider]  ?Vitamin D, Ergocalciferol, (DRISDOL) 1.25 MG (50000 UNIT) CAPS capsule Take 50,000 Units by mouth once a week. 07/22/21  Yes [provider]  ? ? ?Family History ?History reviewed. No pertinent family history. ? ?Social History ?Social History  ? ?Tobacco Use  ? Smoking status: Every Day  ?  Packs/day: 0.30  ?  Years: 3.00  ?  Pack years: 0.90  ?  Types: Cigarettes  ? Smokeless tobacco: Never  ?Substance Use Topics  ? Alcohol use: No  ? Drug use: No  ? ? ? ?Allergies   ?Patient has no known allergies. ? ? ?Review of Systems ?  Review of Systems ?Per HPI ? ?Physical Exam ?Triage Vital Signs ?ED Triage Vitals  ?Enc Vitals Group  ?   BP 08/29/21 1144 125/80  ?   Pulse Rate 08/29/21 1144 82  ?   Resp 08/29/21 1144 18  ?   Temp 08/29/21 1144 97.7 ?F (36.5 ?C)  ?   Temp Source 08/29/21 1144 Oral  ?   SpO2 08/29/21 1144 98 %  ?   Weight 08/29/21 1146 250 lb (113.4 kg)  ?   Height 08/29/21 1146 6\' 5"  (1.956 m)  ?   Head Circumference --   ?   Peak Flow --   ?   Pain Score 08/29/21 1145 10  ?   Pain Loc --   ?   Pain Edu? --   ?   Excl. in GC? --   ? ?No data found. ? ?Updated Vital Signs ?BP 125/80 (BP Location: Left Arm)   Pulse 82   Temp 97.7 ?F (36.5 ?C) (Oral)   Resp 18   Ht  6\' 5"  (1.956 m)   Wt 250 lb (113.4 kg)   SpO2 98%   BMI 29.65 kg/m?  ? ?Visual Acuity ?Right Eye Distance:   ?Left Eye Distance:   ?Bilateral Distance:   ? ?Right Eye Near:   ?Left Eye Near:    ?Bilateral Near:    ? ?Physical Exam ?Constitutional:   ?   General: He is not in acute distress. ?   Appearance: Normal appearance. He is not toxic-appearing or diaphoretic.  ?HENT:  ?   Head: Normocephalic and atraumatic.  ?Eyes:  ?   Extraocular Movements: Extraocular movements intact.  ?   Conjunctiva/sclera: Conjunctivae normal.  ?Pulmonary:  ?   Effort: Pulmonary effort is normal.  ?Feet:  ?   Comments: Purulent drainage from toenail beds bilaterally to second digit of both feet.  There is a black discoloration located  to entirety of left toe nailbed.  Patient has full range of motion of toes.  Neurovascular appears to be intact. ?Neurological:  ?   General: No focal deficit present.  ?   Mental Status: He is alert and oriented to person, place, and time. Mental status is at baseline.  ?Psychiatric:     ?   Mood and Affect: Mood normal.     ?   Behavior: Behavior normal.     ?   Thought Content: Thought content normal.     ?   Judgment: Judgment normal.  ? ? ? ?UC Treatments / Results  ?Labs ?(all labs ordered are listed, but only abnormal results are displayed) ?Labs Reviewed - No data to display ? ?EKG ? ? ?Radiology ?No results found. ? ?Procedures ?Procedures (including critical care time) ? ?Medications Ordered in UC ?Medications - No data to display ? ?Initial Impression / Assessment and Plan / UC Course  ?I have reviewed the triage vital signs and the nursing notes. ? ?Pertinent labs & imaging results that were available during my care of the patient were reviewed by me and considered in my medical decision making (see chart for details). ? ?  ? ?Toenail beds appear to be infected from previous toenail removal.  Podiatry was contacted given black discoloration to left toe nailbed.  Unable to determine if this  is collection of drainage or worrisome discoloration.  Podiatry advised cephalexin antibiotic and follow-up with podiatry as soon as possible.  Dr. Marylene LandStover with podiatry advised that clinical staff will call patient today to set up  this sooner appointment for reevaluation post toenail removal.  Podiatry also advised painting toes with Betadine until further advised by podiatry.  Nonadherent dressing with Betadine was applied in urgent care today.  Patient was advised of all of this.  Discussed strict return and ER precautions.  Patient verbalized understanding and was agreeable with plan. ?Final Clinical Impressions(s) / UC Diagnoses  ? ?Final diagnoses:  ?Toe infection  ? ? ? ?Discharge Instructions   ? ?  ?You have been prescribed antibiotic to treat infection.  Please use Betadine on toes.  Follow-up with podiatry for further evaluation and management. ? ? ? ? ?ED Prescriptions   ? ? Medication Sig Dispense Auth. Provider  ? cephALEXin (KEFLEX) 500 MG capsule Take 1 capsule (500 mg total) by mouth 4 (four) times daily. 28 capsule Ervin Knack E, [place]  ? ?  ? ?PDMP not reviewed this encounter. ?  ?Gustavus Bryant, [place] ?08/29/21 1222 ? ?  ?Gustavus Bryant, [place] ?08/29/21 1222 ? ?

## 2021-08-29 NOTE — Telephone Encounter (Signed)
Spoke to Pierre from [facility]. Patient having pain and increased drainage and ? purulence and discolored dark tissue in the left second toe nailbed.  Recommend Betadine to the areas covered with Band-Aid once daily.  Patient to be started on Keflex and will be contacted by our office for a sooner outpatient follow-up. ?-Dr. Marylene Land ?

## 2021-08-29 NOTE — Discharge Instructions (Addendum)
You have been prescribed antibiotic to treat infection.  Please use Betadine on toes.  Follow-up with podiatry for further evaluation and management. ?

## 2021-08-31 ENCOUNTER — Ambulatory Visit: Payer: Medicaid Other | Admitting: Podiatry

## 2021-09-12 ENCOUNTER — Ambulatory Visit (INDEPENDENT_AMBULATORY_CARE_PROVIDER_SITE_OTHER): Payer: Medicaid Other | Admitting: Podiatry

## 2021-09-12 ENCOUNTER — Encounter: Payer: Self-pay | Admitting: Podiatry

## 2021-09-12 DIAGNOSIS — M2041 Other hammer toe(s) (acquired), right foot: Secondary | ICD-10-CM

## 2021-09-12 DIAGNOSIS — M2042 Other hammer toe(s) (acquired), left foot: Secondary | ICD-10-CM | POA: Diagnosis not present

## 2021-09-12 DIAGNOSIS — L600 Ingrowing nail: Secondary | ICD-10-CM

## 2021-09-14 NOTE — Progress Notes (Signed)
Subjective:  ? ?Patient ID: Travis Fowler, male   DOB: 31 y.o.   MRN: [id number]  ? ?HPI ?Patient presents with caregiver concerned about the discoloration of the second digit left over right and also considerations for hammertoe repair of the second toes.  Still recovering currently from nail removal ? ? ?ROS ? ? ?   ?Objective:  ?Physical Exam  ?Neurovascular status was found to be intact digits are warm but there is some crusted discoloration on the left second toe which appears to be more dried blood within the digit itself.  The right also shows to a degree but not as much and I did not note any active drainage currently. ? ?   ?Assessment:  ?Patient does have digital deformity but there is also some stress on the digits with the healing from the nail surgery that is causing some discoloration of the toes but no active drainage ? ?   ?Plan:  ?H&P educated him and caregiver on possibility for arthroplasty in future but I do not recommend until everything is healed currently.  I want him to continue to try to reduce his smoking continue soaks and this should eventually turn back to normalization but it may take another several weeks.  He will be seen back as needed with the consideration for surgery at 1 point in future ?   ? ? ?

## 2022-07-05 ENCOUNTER — Ambulatory Visit: Payer: Medicaid Other | Admitting: Podiatry

## 2022-07-10 ENCOUNTER — Encounter: Payer: Self-pay | Admitting: Podiatry

## 2022-07-10 ENCOUNTER — Ambulatory Visit: Payer: Medicaid Other | Admitting: Podiatry

## 2022-07-10 ENCOUNTER — Ambulatory Visit (INDEPENDENT_AMBULATORY_CARE_PROVIDER_SITE_OTHER): Payer: Medicaid Other | Admitting: Podiatry

## 2022-07-10 VITALS — BP 135/84 | HR 102 | Temp 98.1°F | Resp 20

## 2022-07-10 DIAGNOSIS — M2042 Other hammer toe(s) (acquired), left foot: Secondary | ICD-10-CM | POA: Diagnosis not present

## 2022-07-10 DIAGNOSIS — M2041 Other hammer toe(s) (acquired), right foot: Secondary | ICD-10-CM

## 2022-07-11 NOTE — Progress Notes (Signed)
Patient presents with caregiver subjective:   Patient ID: Travis Fowler, male   DOB: 32 y.o.   MRN: [id number]   HPI With thickness of the second digits of both feet distal and digital deformities that could be concerning   ROS      Objective:  Physical Exam  Neurovascular status unchanged with structural changes of the second toe bilateral distal keratotic lesions that can be painful making shoe gear difficult with history of nail removal     Assessment:  Structural changes consistent with digital deformities digit to both feet with chronic thickness of the nailbeds     Plan:  H&P reviewed with him and his caregiver that possible shortening digital procedures may be necessary in future with courtesy debridement of tissue today and patient will be seen back we will reevaluate and decide what might be appropriate long-term

## 2022-07-12 ENCOUNTER — Ambulatory Visit: Payer: Medicaid Other | Admitting: Podiatry

## 2022-07-24 ENCOUNTER — Ambulatory Visit: Payer: Medicaid Other | Admitting: Podiatry

## 2022-08-23 ENCOUNTER — Ambulatory Visit (INDEPENDENT_AMBULATORY_CARE_PROVIDER_SITE_OTHER): Payer: Medicaid Other | Admitting: Podiatry

## 2022-08-23 ENCOUNTER — Encounter: Payer: Self-pay | Admitting: Podiatry

## 2022-08-23 DIAGNOSIS — M2041 Other hammer toe(s) (acquired), right foot: Secondary | ICD-10-CM | POA: Diagnosis not present

## 2022-08-23 DIAGNOSIS — M2042 Other hammer toe(s) (acquired), left foot: Secondary | ICD-10-CM

## 2022-08-24 NOTE — Progress Notes (Signed)
Subjective:   Patient ID: Travis Fowler, male   DOB: 32 y.o.   MRN: [id number]   HPI Patient presents with distal keratotic tissue formation of the second toes both feet history of digital fusion second toe right foot with pain   ROS      Objective:  Physical Exam  Digital deformity consistent with hammertoe deformity digit to bilateral with distal keratotic lesion formation painful when pressed      Assessment:  Hammertoe deformity digit to bilateral with distal keratotic tissue formation     Plan:  Reviewed digital correction this may ultimately require distal arthroplasty that I educated him and caregiver on and at this point did go a simple approach and debrided tissue and applied cushioning

## 2022-09-18 ENCOUNTER — Ambulatory Visit (INDEPENDENT_AMBULATORY_CARE_PROVIDER_SITE_OTHER): Payer: Medicaid Other | Admitting: Podiatry

## 2022-09-18 DIAGNOSIS — Z91199 Patient's noncompliance with other medical treatment and regimen due to unspecified reason: Secondary | ICD-10-CM

## 2022-09-18 NOTE — Progress Notes (Signed)
No show

## 2023-02-08 ENCOUNTER — Encounter: Payer: Self-pay | Admitting: Podiatry

## 2023-02-08 ENCOUNTER — Ambulatory Visit: Payer: MEDICAID | Admitting: Podiatry

## 2023-02-08 DIAGNOSIS — L600 Ingrowing nail: Secondary | ICD-10-CM

## 2023-02-08 DIAGNOSIS — M2042 Other hammer toe(s) (acquired), left foot: Secondary | ICD-10-CM

## 2023-02-08 DIAGNOSIS — M2041 Other hammer toe(s) (acquired), right foot: Secondary | ICD-10-CM

## 2023-02-08 NOTE — Progress Notes (Signed)
Subjective:   Patient ID: Travis Fowler, male   DOB: 32 y.o.   MRN: [id number]   HPI Patient presents with caregiver with chronic digital deformities of the lesser digits nails that get thick and are hard to cut and discomfort at times   ROS      Objective:  Physical Exam  Neurovascular status was found to be intact range of motion adequate subtalar midtarsal joint with patient noted to have elongated second digit left over right with distal deformity of digit to bilateral with keratotic distal lesion formation that can become painful and hard to walk on.  Nail disease noted bilateral     Assessment:  Hammertoe deformity second digits bilateral distal right with previous fusion was done and on the left distal and proximal secondary to deformity with lesion formation.       Plan:  H&P reviewed and I have recommended that at 1 point digital fusion may be necessary along with distal arthroplasty and distal arthroplasty right I discussed.  We are still trying to hold off continue debridement techniques and shoe gear modifications which was done today

## 2023-08-17 ENCOUNTER — Ambulatory Visit: Payer: MEDICAID | Admitting: Podiatry

## 2023-08-17 ENCOUNTER — Encounter: Payer: Self-pay | Admitting: Podiatry

## 2023-08-17 DIAGNOSIS — M2041 Other hammer toe(s) (acquired), right foot: Secondary | ICD-10-CM | POA: Diagnosis not present

## 2023-08-17 DIAGNOSIS — M2042 Other hammer toe(s) (acquired), left foot: Secondary | ICD-10-CM

## 2023-08-20 ENCOUNTER — Telehealth: Payer: Self-pay | Admitting: Urology

## 2023-08-20 NOTE — Telephone Encounter (Signed)
 LM to call back when they are ready to schedule sx with Dr. Charlsie Merles

## 2023-08-21 NOTE — Progress Notes (Signed)
 Subjective:   Patient ID: Travis Fowler, male   DOB: 33 y.o.   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  They want to do it as they are tired of the pain and the deformity and patient is motivated understands risk and I recommended distal arthroplasty bilateral.  Patient and caregiver want this done read consent form and I went over consent form line by line explaining all possible complications and the fact this may not solve the problem.  They are comfortable with this and signed consent

## 2023-08-22 ENCOUNTER — Telehealth: Payer: Self-pay | Admitting: Urology

## 2023-08-22 NOTE — Telephone Encounter (Signed)
 DOS 09/04/23  HAMMERT[employer] REPAIR 2ND BILAT --- [id number]  [employer]   PER [employer] WEBSITE TO CHECK PRIOR AUTH CPT CODE 28413 - CORRECT [place] Pre-authorization required for non-participating providers only.  SPOKE WITH GINA G. WITH [employer] DR. REGAL IS IN NETWORK SO NO AUTH IS REQUIRED FOR CPT CODE 24401.  CALL REF # [id number]

## 2023-09-03 MED ORDER — HYDROCODONE-ACETAMINOPHEN 10-325 MG PO TABS: 1.0000 | ORAL_TABLET | Freq: Three times a day (TID) | ORAL | 0 refills | Status: AC | PRN

## 2023-09-03 NOTE — Addendum Note (Signed)
 Addended by: Lenn Sink on: 09/03/2023 09:57 PM   Modules accepted: Orders

## 2023-09-10 ENCOUNTER — Encounter: Payer: MEDICAID | Admitting: Podiatry

## 2023-09-20 ENCOUNTER — Telehealth: Payer: Self-pay | Admitting: Podiatry

## 2023-09-20 NOTE — Telephone Encounter (Signed)
 Pts case manager Graciella Belton called pt has an abscessed tooth and is scheduled for surgery on 09/25/23. He is going on a 7 day regimen of antibiotics. Should we postpone his surgery?

## 2023-09-21 ENCOUNTER — Telehealth: Payer: Self-pay | Admitting: Podiatry

## 2023-09-21 NOTE — Telephone Encounter (Signed)
 Left message for pts case manager that we will need to reschedule pts surgery due to abcessed tooth and for her to call me to get the surgery r/s

## 2023-09-21 NOTE — Telephone Encounter (Signed)
 Aram Beecham from [facility] called to see what Dr Charlsie Merles wanted to do about this pt since he has a tooth abscess.  The nurse was asking.  I confirmed with Dr Charlsie Merles and we are to be r/s and I let Aram Beecham know, just waiting on case manager to call to r/s

## 2023-09-24 ENCOUNTER — Encounter: Payer: MEDICAID | Admitting: Podiatry

## 2023-09-24 ENCOUNTER — Telehealth: Payer: Self-pay | Admitting: Podiatry

## 2023-09-24 NOTE — Telephone Encounter (Signed)
 Left message for pt to call to r/s surgery that was canceled due to issues with tooth.

## 2023-10-01 ENCOUNTER — Encounter: Payer: MEDICAID | Admitting: Podiatry

## 2023-10-15 ENCOUNTER — Telehealth: Payer: Self-pay | Admitting: Podiatry

## 2023-10-15 NOTE — Telephone Encounter (Signed)
 RECEIVED CALL FROM [facility] AND THEY ARE WORKING ON AUTHORIZATION AS WE ARE OUT OF NETWORK FOR THE SURGERY. THEY NEEDED CLINICALS FAXED TO THEM. FAXED THEM TO [phone] PENDING AUTH NUMBER [id number].

## 2023-10-16 ENCOUNTER — Encounter: Payer: MEDICAID | Admitting: Podiatry

## 2023-10-30 ENCOUNTER — Telehealth: Payer: Self-pay | Admitting: Podiatry

## 2023-10-30 NOTE — Telephone Encounter (Signed)
 DOS: 11/06/23  HAMMER TOE 2ND B/C -16109     EFFECTIVE DATE :  10/24/2023       PER ERICA R OF [facility] CPT CODE 60454 IS APPROVED   AUTH # [id number]

## 2023-11-07 ENCOUNTER — Encounter: Payer: MEDICAID | Admitting: Podiatry

## 2023-11-12 ENCOUNTER — Encounter: Payer: MEDICAID | Admitting: Podiatry

## 2023-11-19 ENCOUNTER — Encounter: Payer: MEDICAID | Admitting: Podiatry

## 2023-11-26 ENCOUNTER — Encounter: Payer: MEDICAID | Admitting: Podiatry

## 2024-03-19 ENCOUNTER — Ambulatory Visit: Payer: MEDICAID | Admitting: Podiatry

## 2024-03-19 ENCOUNTER — Ambulatory Visit (INDEPENDENT_AMBULATORY_CARE_PROVIDER_SITE_OTHER): Payer: MEDICAID

## 2024-03-19 ENCOUNTER — Encounter: Payer: Self-pay | Admitting: Podiatry

## 2024-03-19 DIAGNOSIS — M2041 Other hammer toe(s) (acquired), right foot: Secondary | ICD-10-CM | POA: Diagnosis not present

## 2024-03-19 DIAGNOSIS — M2042 Other hammer toe(s) (acquired), left foot: Secondary | ICD-10-CM | POA: Diagnosis not present

## 2024-03-19 NOTE — Progress Notes (Signed)
 Subjective:   Patient ID: Travis Fowler, male   DOB: 33 y.o.   MRN: [id number]   HPI Patient presents with caregiver states he is ready to get these toes fixed   ROS      Objective:  Physical Exam  Neurovascular status intact with significant digital structural issues second digit bilateral with distal deformity bilateral with distal keratotic lesion.  Good digital perfusion noted     Assessment:  Chronic hammertoe deformity digit 2 bilateral     Plan:  H&P reviewed he did not follow through for surgery 6 months ago but promises he wants to get it done now and I allowed him to read then signed consent form after extensive review.  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

## 2024-03-20 ENCOUNTER — Telehealth: Payer: Self-pay | Admitting: Podiatry

## 2024-03-20 NOTE — Telephone Encounter (Signed)
 DOS- 03/25/2024  HAMMERTOE REPAIR 2ND BIL- 71714  [facility] EFFECTIVE DATE- 12/11/2022  PER [facility] PORTAL, NO PRIOR AUTH IS REQUIRED FOR CPT CODE 71714 (2 UNITS).

## 2024-03-20 NOTE — Telephone Encounter (Signed)
 Called to schedule surgery for patient and surgery has been set for 10/14 per patients request. Confirmed receipt of surgical bag and aware [facility] will contact 24-48 hours in advance with arrival time. Pt is not currently on any blood thinners or GLP1 medications.Patient currently using [facility] for prescriptions- entered and set as preferred in patients chart.

## 2024-03-25 ENCOUNTER — Telehealth: Payer: Self-pay | Admitting: Podiatry

## 2024-03-25 DIAGNOSIS — M2042 Other hammer toe(s) (acquired), left foot: Secondary | ICD-10-CM | POA: Diagnosis not present

## 2024-03-25 DIAGNOSIS — M2041 Other hammer toe(s) (acquired), right foot: Secondary | ICD-10-CM | POA: Diagnosis not present

## 2024-03-25 NOTE — Telephone Encounter (Signed)
 Received call that patient did not attend surgery this morning due to the fact that their was a change in the person in charge of him and his case. Charmaine Hint is patients current caregiver/ support staff and reached out to notify office of this. She was only notified of the surgery in the past 24 hours and was unable to arrange for patient to be at [facility]. I have updated patients contact information to new case workers information and we have rescheduled patient for 11/11.

## 2024-04-22 DIAGNOSIS — M2041 Other hammer toe(s) (acquired), right foot: Secondary | ICD-10-CM | POA: Diagnosis not present

## 2024-04-22 DIAGNOSIS — M2042 Other hammer toe(s) (acquired), left foot: Secondary | ICD-10-CM | POA: Diagnosis not present

## 2024-04-28 ENCOUNTER — Encounter: Payer: Self-pay | Admitting: Podiatry

## 2024-04-28 ENCOUNTER — Ambulatory Visit (INDEPENDENT_AMBULATORY_CARE_PROVIDER_SITE_OTHER): Payer: MEDICAID

## 2024-04-28 ENCOUNTER — Ambulatory Visit (INDEPENDENT_AMBULATORY_CARE_PROVIDER_SITE_OTHER): Payer: MEDICAID | Admitting: Podiatry

## 2024-04-28 VITALS — BP 137/77 | HR 93 | Temp 97.7°F

## 2024-04-28 DIAGNOSIS — M2041 Other hammer toe(s) (acquired), right foot: Secondary | ICD-10-CM

## 2024-04-28 DIAGNOSIS — M2042 Other hammer toe(s) (acquired), left foot: Secondary | ICD-10-CM | POA: Diagnosis not present

## 2024-04-28 NOTE — Progress Notes (Signed)
 Subjective:   Patient ID: Travis Fowler, male   DOB: 33 y.o.   MRN: [id number]   HPI Patient seen by nurse presents with caregiver today doing well   ROS      Objective:  Physical Exam  Second toes excellent alignment     Assessment:  Doing well post lifting the second digit bilateral     Plan:  X-rays reviewed indicating satisfactory resection had a proximal phalanx digit 2 bilateral dressings applied continue open toed shoes reappoint several weeks for suture removal

## 2024-04-28 NOTE — Patient Instructions (Signed)

## 2024-04-28 NOTE — Progress Notes (Signed)
 Patient presents for post-op visit today, POV #1 DOS 04/22/2024 RT 2ND HAMMER TOE REPAIR, LT 2ND HAMMER TOE REPAIR  I have been walking a lot. Some stinging, I have been taking ibuprofen . When it is cold, feet hurt a little.  Patient has case worker with him today. .  RN Notes: n/a  Vital Signs: Today's Vitals   04/28/24 1038  BP: 137/77  Pulse: 93  Temp: 97.7 F (36.5 C)  PainSc: 0-No pain      Radiographs: [x]  Taken []  Not taken  Left Foot Surgical Site Assessment:  - Dressing:  [x]  Minimal dry blood, intact []  Reinforced   []  Changed     -RN Notes: n/a  - Incision:  [x]  CDI (clean, dry, intact)  []  Mild erythema  []  Drainage noted   -RN Notes: n/a  - Swelling:  []  None  [x]  Mild  []  Moderate   []  Significant     -RN Notes: n/a  - Bruising:  []  None  [x]  Present: on toe   - Sutures/Staples:  []  None [x]  Intact  []  Removed Today  [x]  Plan to remove at next visit   -Cast/Splint/Pins: [x]  None []  Intact []  Removed Today []  Plan to remove at next visit []  Replaced  -Signs of infection:  [x]  None  []  Present - Describe: n/a  -DME:    []  None []  AFW [x]  Surgical shoe []  Cast  []  Splint  -Walking status:  [x]  Full WB  []  Partial WB  []  NWB  -Utilizing device:  [x]  None []  Knee Scooter []  Crutches []  Wheelchair    Right Foot Surgical Site Assessment:  - Dressing:  [x]  Minimal dry blood, intact []  Reinforced   []  Changed     -RN Notes: n/a  - Incision:  [x]  CDI (clean, dry, intact)  [x]  Mild erythema  []  Drainage noted   -RN Notes: n/a  - Swelling:  []  None  [x]  Mild  []  Moderate   []  Significant     -RN Notes: n/a  - Bruising:  []  None  [x]  Present: on toe   - Sutures/Staples:  []  None []  Intact  []  Removed Today  [x]  Plan to remove at next visit   -Cast/Splint/Pins: [x]  None []  Intact []  Removed Today []  Plan to remove at next visit []  Replaced  -Signs of infection:  [x]  None  []  Present - Describe: n/a  -DME:    []  None []  AFW [x]  Surgical shoe []   Cast  []  Splint  -Walking status:  [x]  Full WB  []  Partial WB  []  NWB  -Utilizing device:  [x]  None []  Knee Scooter []  Crutches []  Wheelchair   DVT assessment:  [x]  Denies symptoms []  Chest pain/SOB []  Pain in calf/redness/warmth   Redressed DSD and ace wrap. Educated on signs of infection, proper dressing care, pain management, and weight bearing status. Patient will contact provider with any new or worsening symptoms. The provider assessed the patient today and reviewed instructions regarding plan of care.

## 2024-05-12 ENCOUNTER — Encounter: Payer: MEDICAID | Admitting: Podiatry

## 2024-05-14 ENCOUNTER — Ambulatory Visit: Payer: MEDICAID | Admitting: Podiatry

## 2024-05-14 DIAGNOSIS — M2042 Other hammer toe(s) (acquired), left foot: Secondary | ICD-10-CM

## 2024-05-14 DIAGNOSIS — M2041 Other hammer toe(s) (acquired), right foot: Secondary | ICD-10-CM

## 2024-05-14 NOTE — Progress Notes (Signed)
 Subjective:   Patient ID: Travis Fowler, male   DOB: 33 y.o.   MRN: [id number]   HPI Patient seen by nurse   ROS      Objective:  Physical Exam  Stitches intact good alignment digits     Assessment:  Doing well     Plan:  Stitches removed toes in good alignment x-rays show satisfactory resection of bone patient discharged can gradually return to soft shoe gear over the next couple weeks and discussed with his caregiver

## 2024-05-14 NOTE — Progress Notes (Signed)
 Patient presents for post-op visit today, POV #2 DOS 04/22/2024 RT 2ND HAMMER TOE REPAIR, LT 2ND HAMMER TOE REPAIR  Doing good, wore regular shoes earlier. No pain except for where the stitches are, like you want to scratch it but can't..  RN Notes: Patient has caregiver in room with him.   Vital Signs: Today's Vitals   05/14/24 0843  PainSc: 0-No pain      Radiographs: []  Taken [x]  Not taken  Surgical Site Assessment:  - Dressing:  []  Minimal dry blood, intact []  Reinforced   []  Changed     -RN Notes: No dressing in place  - Incision:  [x]  CDI (clean, dry, intact)  []  Mild erythema  []  Drainage noted   -RN Notes: n/a  - Swelling:  []  None  [x]  Mild  []  Moderate   []  Significant     -RN Notes: n/a  - Bruising:  [x]  None  []  Present: n/a   - Sutures/Staples:  []  None [x]  Intact  [x]  Removed Today  []  Plan to remove at next visit   -Cast/Splint/Pins: [x]  None []  Intact []  Removed Today []  Plan to remove at next visit []  Replaced  -Signs of infection:  [x]  None  []  Present - Describe: n/a  -DME:    [x]  None []  AFW []  Surgical shoe []  Cast  []  Splint  -Walking status:  [x]  Full WB  []  Partial WB  []  NWB  -Utilizing device:  [x]  None []  Knee Scooter []  Crutches []  Wheelchair    DVT assessment:  [x]  Denies symptoms []  Chest pain/SOB []  Pain in calf/redness/warmth   Redressed DSD and ace wrap. Educated on signs of infection, proper dressing care, pain management, and weight bearing status. Patient will contact provider with any new or worsening symptoms. The provider assessed the patient today and reviewed instructions regarding plan of care.
